# Patient Record
Sex: Male | Born: 1970 | Hispanic: Yes | Marital: Married | Smoking: Never smoker
Health system: Southern US, Community
[De-identification: ages and names within clinical notes are randomized; demographics above are authoritative.]

## PROBLEM LIST (undated history)

## (undated) DIAGNOSIS — A048 Other specified bacterial intestinal infections: Secondary | ICD-10-CM

## (undated) HISTORY — PX: UPPER GI ENDOSCOPY: SHX6162

## (undated) HISTORY — DX: Other specified bacterial intestinal infections: A04.8

---

## 2018-07-30 ENCOUNTER — Other Ambulatory Visit: Payer: Self-pay

## 2018-07-30 ENCOUNTER — Encounter: Payer: Self-pay | Admitting: Family Medicine

## 2018-07-30 ENCOUNTER — Ambulatory Visit: Payer: Self-pay | Attending: Family Medicine | Admitting: Family Medicine

## 2018-07-30 VITALS — BP 143/78 | HR 63 | Temp 98.4°F | Resp 18 | Ht 63.0 in | Wt 136.2 lb

## 2018-07-30 DIAGNOSIS — J392 Other diseases of pharynx: Secondary | ICD-10-CM

## 2018-07-30 DIAGNOSIS — H9201 Otalgia, right ear: Secondary | ICD-10-CM

## 2018-07-30 DIAGNOSIS — H9191 Unspecified hearing loss, right ear: Secondary | ICD-10-CM | POA: Insufficient documentation

## 2018-07-30 DIAGNOSIS — K219 Gastro-esophageal reflux disease without esophagitis: Secondary | ICD-10-CM | POA: Insufficient documentation

## 2018-07-30 DIAGNOSIS — H6091 Unspecified otitis externa, right ear: Secondary | ICD-10-CM

## 2018-07-30 DIAGNOSIS — J029 Acute pharyngitis, unspecified: Secondary | ICD-10-CM | POA: Insufficient documentation

## 2018-07-30 DIAGNOSIS — Z833 Family history of diabetes mellitus: Secondary | ICD-10-CM | POA: Insufficient documentation

## 2018-07-30 MED ORDER — PANTOPRAZOLE SODIUM 40 MG PO TBEC: 40.0000 mg | DELAYED_RELEASE_TABLET | Freq: Two times a day (BID) | ORAL | 1 refills | Status: DC

## 2018-07-30 MED ORDER — CIPROFLOXACIN HCL 500 MG PO TABS: 500.0000 mg | ORAL_TABLET | Freq: Two times a day (BID) | ORAL | 0 refills | Status: DC

## 2018-07-30 MED FILL — CIPROFLOXACIN HCL 500 MG TA: 500 | 10 days supply | Qty: 20 | Fill #0

## 2018-07-30 MED FILL — PANTOPRAZOLE SOD DR 40 MG T: 40 | 15 days supply | Qty: 30 | Fill #0

## 2018-07-30 NOTE — Progress Notes (Signed)
Subjective:    Patient ID: Douglas Morales, male    DOB: 1970/12/01, 47 y.o.   MRN: [id number]  HPI 47 year old male new to the practice.  Patient was offered video interpreter services secondary to language barrier but patient requested that his adult daughter translate for today's visit.  Per patient and daughter, patient has had right-sided ear pain off and on for more than a year.  Patient has been treated several times with antibiotics.  Patient and daughter report however that about 45 days ago, patient again had onset of right ear pain with drainage status post a trip to the beach.  Daughter states that patient was seen 2-3 times for his ear pain and she has antibiotic bottles with her for Augmentin 875 mg which she states he was initially prescribed to be on for 5 days and then later prescription for same antibiotic for 10 days.  Patient reports that prior to taking the antibiotics, he did have drainage/pus from his ear but now has not seen any recent drainage.  Patient does feel as if he has had some hearing loss in the right ear.  Patient states that he chronically feels that he has a dull pain in his right ear and also the area surrounding his ear which is about a 3 or 4 but then he sometimes has very sharp pain which is about a 8 or 9.  Pain feels as if it is in his ear and radiates to his throat.  Patient also with complaint of sore throat and patient has had a few days of sensation of nasal congestion with postnasal drainage.  Patient however states that the sore throat has been going on for months and patient feels as if it is difficult to swallow his saliva at the times as it makes his throat feel irritated.  Patient does not feel as if food actually gets stuck when he is eating but he does feel as if eating or drinking irritates his throat.  Patient does have some burping and belching and was on medication for reflux in the past.  Patient has not recently been on reflux medicine.   Patient denies any abdominal pain and no nausea.  Patient has had no dark stools and no blood in the stool.  Patient denies recent fever but did feel as if he was having chills last night.  Patient denies dizziness but feels as if there is a headache in the area behind and above his right ear along the scalp.  Patient/daughter are interested in patient seeing a specialist.  Daughter states that patient has gotten to the point where he has difficulty sleeping secondary to worrying about what may be wrong with his throat and ear as he is seen several doctors and has been taking antibiotics off and on and continues to have ear pain and pain/irritation in his throat.      Patient denies any other past medical problems.  Patient has had no past surgeries.  Patient has family history significant for diabetes.  Patient does not smoke but did smoke occasionally as a teenager.   Review of Systems  Constitutional: Positive for chills and fatigue. Negative for fever and unexpected weight change.  HENT: Positive for congestion, ear pain, hearing loss, sore throat and trouble swallowing. Negative for dental problem, facial swelling, sinus pressure and voice change.   Respiratory: Negative for cough and shortness of breath.   Cardiovascular: Negative for chest pain, palpitations and leg swelling.  Gastrointestinal: Negative  for abdominal pain, blood in stool and nausea.  Genitourinary: Negative for dysuria and frequency.  Musculoskeletal: Negative for arthralgias and back pain.  Neurological: Positive for headaches. Negative for dizziness.       Objective:   Physical Exam BP (!) 143/78   Pulse 63   Temp 98.4 F (36.9 C) (Oral)   Resp 18   Ht 5\' 3"  (1.6 m)   Wt 136 lb 3.2 oz (61.8 kg)   SpO2 98%   BMI 24.13 kg/m  Vital signs and nurse's note reviewed General- well-nourished, well-developed male in no acute distress.  Patient is accompanied by his daughter at today's visit ENT- patient with mild  discomfort with palpation at the tragus of the right ear.  Patient with some mild erythema of the ear canal and patient does have some whitish drainage/discharge in the posterior ear canal near the TM.  Patient with some erythema of the inferior portion of the right TM.  Patient's left TM is gray and within normal.  Patient with mild edema of the nasal mucosa with mild clear discharge, patient with mild posterior pharynx erythema. Neck-supple, patient does have some discomfort with palpation along the anterior cervical chain lymph nodes at the level of the thyroid.  No thyromegaly, no appreciable lymphadenopathy. Lungs-clear to auscultation bilaterally. Cardiovascular-regular rate and rhythm Abdomen-soft, nontender Back-no CVA tenderness Extremities-no edema      Assessment & Plan:  1. Right ear pain Patient with complaint of more than a month of persistent right ear pain and patient with sensation of decreased hearing in the right ear.  Patient will be referred to ENT for further evaluation - Ambulatory referral to ENT  2. Otitis externa of right ear, unspecified chronicity, unspecified type Patient's daughter has antibiotic bottles showing that patient has been treated with Augmentin 875 mg x 2 within the past month.  Patient likely has otitis externa as he did have some tenderness at the right tragus of the ear as well as some discharge within the ear canal.  Patient will be placed on Cipro.  Patient will also be referred to ENT secondary to his ear pain and sensation of hearing loss in the right ear. - Ambulatory referral to ENT - ciprofloxacin (CIPRO) 500 MG tablet; Take 1 tablet (500 mg total) by mouth 2 (two) times daily.  Dispense: 20 tablet; Refill: 0  3. Throat irritation Patient with complaint of chronic throat irritation with increased discomfort with swallowing.  Patient will be placed on Protonix in case his throat irritation is related to acid reflux/GERD but patient will also be  referred to ENT for further evaluation and treatment - Ambulatory referral to ENT - pantoprazole (PROTONIX) 40 MG tablet; Take 1 tablet (40 mg total) by mouth 2 (two) times daily. To reduce stomach acid  Dispense: 30 tablet; Refill: 1  An After Visit Summary was printed and given to the patient.  Return for as needed; 2 weeks if not seen by ENT.

## 2018-07-30 NOTE — Progress Notes (Signed)
Patient requested for  Flu shot  Pain in ear has been going on for about 45 days

## 2018-08-18 ENCOUNTER — Ambulatory Visit: Payer: Self-pay | Admitting: Family Medicine

## 2019-06-09 ENCOUNTER — Emergency Department (HOSPITAL_COMMUNITY)
Admission: EM | Admit: 2019-06-09 | Discharge: 2019-06-10 | Disposition: A | Discharge status: Home / Self Care | Payer: Self-pay | Attending: Emergency Medicine | Admitting: Emergency Medicine

## 2019-06-09 ENCOUNTER — Encounter (HOSPITAL_COMMUNITY): Payer: Self-pay

## 2019-06-09 ENCOUNTER — Other Ambulatory Visit: Payer: Self-pay

## 2019-06-09 DIAGNOSIS — R1013 Epigastric pain: Secondary | ICD-10-CM | POA: Insufficient documentation

## 2019-06-09 DIAGNOSIS — Z79899 Other long term (current) drug therapy: Secondary | ICD-10-CM | POA: Insufficient documentation

## 2019-06-09 LAB — URINALYSIS, ROUTINE W REFLEX MICROSCOPIC
Bilirubin Urine: NEGATIVE
Glucose, UA: NEGATIVE mg/dL
Hgb urine dipstick: NEGATIVE
Ketones, ur: NEGATIVE mg/dL
Leukocytes,Ua: NEGATIVE
Nitrite: NEGATIVE
Protein, ur: NEGATIVE mg/dL
Specific Gravity, Urine: 1.02 (ref 1.005–1.030)
pH: 6 (ref 5.0–8.0)

## 2019-06-09 LAB — COMPREHENSIVE METABOLIC PANEL
ALT: 25 U/L (ref 0–44)
AST: 24 U/L (ref 15–41)
Albumin: 4.4 g/dL (ref 3.5–5.0)
Alkaline Phosphatase: 74 U/L (ref 38–126)
Anion gap: 8 (ref 5–15)
BUN: 12 mg/dL (ref 6–20)
CO2: 27 mmol/L (ref 22–32)
Calcium: 9.3 mg/dL (ref 8.9–10.3)
Chloride: 101 mmol/L (ref 98–111)
Creatinine, Ser: 1 mg/dL (ref 0.61–1.24)
GFR calc Af Amer: >60 mL/min (ref >60)
GFR calc non Af Amer: >60 mL/min (ref >60)
Glucose, Bld: 79 mg/dL (ref 70–99)
Potassium: 3.2 mmol/L — ABNORMAL LOW (ref 3.5–5.1)
Sodium: 136 mmol/L (ref 135–145)
Total Bilirubin: 0.7 mg/dL (ref 0.3–1.2)
Total Protein: 7.3 g/dL (ref 6.5–8.1)

## 2019-06-09 LAB — CBC
HCT: 41.4 % (ref 39.0–52.0)
Hemoglobin: 14.5 g/dL (ref 13.0–17.0)
MCH: 30.9 pg (ref 26.0–34.0)
MCHC: 35 g/dL (ref 30.0–36.0)
MCV: 88.3 fL (ref 80.0–100.0)
Platelets: 307 10*3/uL (ref 150–400)
RBC: 4.69 MIL/uL (ref 4.22–5.81)
RDW: 12.6 % (ref 11.5–15.5)
WBC: 7.5 10*3/uL (ref 4.0–10.5)
nRBC: 0 % (ref 0.0–0.2)

## 2019-06-09 LAB — LIPASE, BLOOD: Lipase: 27 U/L (ref 11–51)

## 2019-06-09 MED ORDER — SODIUM CHLORIDE 0.9% FLUSH: 3.0000 mL | Freq: Once | INTRAVENOUS | Status: DC

## 2019-06-09 NOTE — ED Triage Notes (Signed)
Pt reports abd pain for 3 months and a burning sensation when he eats. Pt was seen at PCP 1 month ago, given rx for GERD and GI referral in Aug. Pt came here due to continued pain

## 2019-06-09 NOTE — ED Notes (Signed)
Call wife with any updates

## 2019-06-10 ENCOUNTER — Encounter (HOSPITAL_COMMUNITY): Payer: Self-pay | Admitting: Student

## 2019-06-10 ENCOUNTER — Emergency Department (HOSPITAL_COMMUNITY): Payer: Self-pay

## 2019-06-10 MED ORDER — SUCRALFATE 1 GM/10ML PO SUSP: 1.0000 g | Freq: Three times a day (TID) | ORAL | 0 refills | Status: DC

## 2019-06-10 MED ORDER — POTASSIUM CHLORIDE CRYS ER 20 MEQ PO TBCR
40.0000 meq | EXTENDED_RELEASE_TABLET | Freq: Once | ORAL | Status: AC
  Administered 2019-06-10: 40 meq via ORAL
  Filled 2019-06-10: qty 2

## 2019-06-10 MED ORDER — POTASSIUM CHLORIDE CRYS ER 20 MEQ PO TBCR: 20.0000 meq | EXTENDED_RELEASE_TABLET | Freq: Every day | ORAL | 0 refills | Status: DC

## 2019-06-10 MED ORDER — PANTOPRAZOLE SODIUM 40 MG PO TBEC: 40.0000 mg | DELAYED_RELEASE_TABLET | Freq: Every day | ORAL | 0 refills | Status: DC

## 2019-06-10 MED ORDER — FAMOTIDINE IN NACL 20-0.9 MG/50ML-% IV SOLN
20.0000 mg | Freq: Once | INTRAVENOUS | Status: AC
  Administered 2019-06-10: 20 mg via INTRAVENOUS
  Filled 2019-06-10: qty 50

## 2019-06-10 MED ORDER — SODIUM CHLORIDE 0.9 % IV BOLUS
500.0000 mL | Freq: Once | INTRAVENOUS | Status: AC
  Administered 2019-06-10: 01:00:00 via INTRAVENOUS

## 2019-06-10 MED ORDER — LIDOCAINE VISCOUS HCL 2 % MT SOLN
15.0000 mL | Freq: Once | OROMUCOSAL | Status: AC
  Administered 2019-06-10: 15 mL via ORAL
  Filled 2019-06-10: qty 15

## 2019-06-10 MED ORDER — ONDANSETRON 4 MG PO TBDP: 4.0000 mg | ORAL_TABLET | Freq: Three times a day (TID) | ORAL | 0 refills | Status: DC | PRN

## 2019-06-10 MED ORDER — ALUM & MAG HYDROXIDE-SIMETH 200-200-20 MG/5ML PO SUSP
30.0000 mL | Freq: Once | ORAL | Status: AC
  Administered 2019-06-10: 30 mL via ORAL
  Filled 2019-06-10: qty 30

## 2019-06-10 NOTE — Discharge Instructions (Addendum)
You were seen in the emergency department today for abdominal pain your work-up was reassuring.  Your potassium was a bit low, please follow attached diet guidelines and take the supplement provided.  We suspect your stomach pain is related to acid buildup/irritation in her sending you home with Protonix and Carafate to help with this.  We are sending home with Zofran to take as needed for nausea/vomiting.  We have prescribed you new medication(s) today. Discuss the medications prescribed today with your pharmacist as they can have adverse effects and interactions with your other medicines including over the counter and prescribed medications. Seek medical evaluation if you start to experience new or abnormal symptoms after taking one of these medicines, seek care immediately if you start to experience difficulty breathing, feeling of your throat closing, facial swelling, or rash as these could be indications of a more serious allergic reaction Please take your medicines as prescribed.  Please follow attached diet guidelines to help with your potassium levels as well as to help with stomach irritation.  Follow-up with your GI doctor or call 1 of the GI doctors listed in your discharge instructions for an appointment sooner.  Follow-up with your primary care within 3 days.  Return to the ER for new or worsening symptoms including but not limited to worsening pain, inability to keep fluids down, vomiting blood, blood in your stool, or fever, or any other concerns.  Google Translate:  Usted fue visto hoy en el departamento de emergencias por dolor abdominal, su trabajo fue tranquilizador. Su potasio estaba un poco bajo, siga las pautas de dieta adjuntas y tome el suplemento proporcionado. Sospechamos que su dolor de estmago est relacionado con la acumulacin de cido / irritacin en ella envindole a casa con Protonix y Carafate para ayudar con esto. Estamos enviando a casa con Zofran para tomar segn sea  necesario para las nuseas / vmitos.  Le hemos recetado nuevos medicamentos hoy. Discuta los medicamentos recetados hoy con su farmacutico, ya que pueden tener efectos adversos e interacciones con sus otros medicamentos, incluidos los de venta libre y los medicamentos recetados. Busque evaluacin mdica si comienza a experimentar sntomas nuevos o anormales despus de tomar uno de estos medicamentos, busque atencin mdica de inmediato si comienza a experimentar dificultad para respirar, sensacin de cierre de garganta, hinchazn facial o sarpullido, ya que estos podran ser indicios de un problema ms grave. reaccin alrgica Por favor tome sus medicamentos como se los recetaron. Siga las pautas de dieta adjuntas para ayudar con sus niveles de potasio, as como para ayudar con la irritacin estomacal.  Haga un seguimiento con su mdico GI o llame a 1 de los mdicos GI que figuran en sus instrucciones de alta para una cita antes. Haga un seguimiento con su atencin primaria dentro de los 3 das. Regrese a la sala de emergencias por sntomas nuevos o que empeoren, que [place], [place] otros, empeoramiento del [place], incapacidad para [employer] lquidos, vmitos de [place], [place] en las heces o [place], o cualquier otra inquietud.

## 2019-06-10 NOTE — ED Provider Notes (Signed)
[facility] Provider Note   CSN: [id number] Arrival date & time: 06/09/19  2038     History   Chief Complaint Chief Complaint  Patient presents with  . Abdominal Pain    HPI Mont DuttonJose Alfredo Morales is a 48 y.o. male without significant past medical hx who presents to the ED w/ complaints of abdominal pain x 3 months. Patient describes the pain as a burning discomfort to the epigastric area which is there the majority of the time, but is not constant. He states it is currently an 8/10 in severity, worse with eating, no alleviating factors. He has had nausea w/o vomiting. He initially was having constipation but this has improved, last BM today somewhat small amount but has had decreased PO intake. Seen by PCP for sxs- given course of protonix which he took w/o relief & is now out of now, only took short course. Has referral to GI 08/05, states given continued pain came to ED. Denies fever, chills, emesis, hematemesis, diarrhea, melena, hematochezia, chest pain, or dyspnea. He drinks alcohol very rarely. Does not take NSAIDs regularly. No prior abdominal surgeries.   Translator line utilized throughout Audiological scientistencounter.     HPI  History reviewed. No pertinent past medical history.  There are no active problems to display for this patient.   History reviewed. No pertinent surgical history.      Home Medications    Prior to Admission medications   Medication Sig Start Date End Date Taking? Authorizing Provider  ciprofloxacin (CIPRO) 500 MG tablet Take 1 tablet (500 mg total) by mouth 2 (two) times daily. 07/30/18   Fulp, Cammie, MD  pantoprazole (PROTONIX) 40 MG tablet Take 1 tablet (40 mg total) by mouth 2 (two) times daily. To reduce stomach acid 07/30/18   Cain SaupeFulp, Cammie, MD    Family History History reviewed. No pertinent family history.  Social History Social History   Tobacco Use  . Smoking status: Never Smoker  . Smokeless tobacco:  Never Used  Substance Use Topics  . Alcohol use: Not on file    Comment: occasionally   . Drug use: Never     Allergies   Patient has no known allergies.   Review of Systems Review of Systems  Constitutional: Negative for chills and fever.  Respiratory: Negative for shortness of breath.   Cardiovascular: Negative for chest pain.  Gastrointestinal: Positive for abdominal pain, constipation (resolved per patient) and nausea. Negative for anal bleeding, blood in stool, diarrhea and vomiting.  Genitourinary: Negative for dysuria, scrotal swelling and testicular pain.  All other systems reviewed and are negative.    Physical Exam Updated Vital Signs BP (!) 155/82 (BP Location: Right Arm)   Pulse (!) 56   Temp 98.1 F (36.7 C) (Oral)   Resp 19   SpO2 97%   Physical Exam Vitals signs and nursing note reviewed.  Constitutional:      General: He is not in acute distress.    Appearance: He is well-developed. He is not toxic-appearing.  HENT:     Head: Normocephalic and atraumatic.  Eyes:     General:        Right eye: No discharge.        Left eye: No discharge.     Conjunctiva/sclera: Conjunctivae normal.  Neck:     Musculoskeletal: Neck supple.  Cardiovascular:     Rate and Rhythm: Normal rate and regular rhythm.  Pulmonary:     Effort: Pulmonary effort is normal. No  respiratory distress.     Breath sounds: Normal breath sounds. No wheezing, rhonchi or rales.  Abdominal:     General: Bowel sounds are normal. There is no distension.     Palpations: Abdomen is soft.     Tenderness: There is abdominal tenderness in the epigastric area. There is no guarding or rebound. Negative signs include Murphy's sign and McBurney's sign.  Skin:    General: Skin is warm and dry.     Findings: No rash.  Neurological:     Mental Status: He is alert.     Comments: Clear speech.   Psychiatric:        Behavior: Behavior normal.      ED Treatments / Results  Labs (all labs  ordered are listed, but only abnormal results are displayed) Labs Reviewed  COMPREHENSIVE METABOLIC PANEL - Abnormal; Notable for the following components:      Result Value   Potassium 3.2 (*)    All other components within normal limits  LIPASE, BLOOD  CBC  URINALYSIS, ROUTINE W REFLEX MICROSCOPIC    EKG None  Radiology Dg Abd Acute W/chest  Result Date: 06/10/2019 CLINICAL DATA:  Abdominal pain for 3 months. EXAM: DG ABDOMEN ACUTE W/ 1V CHEST COMPARISON:  None. FINDINGS: The cardiomediastinal contours are normal. The lungs are clear. There is no free intra-abdominal air. No dilated bowel loops to suggest obstruction. Small volume of stool throughout the colon. No radiopaque calculi. No acute osseous abnormalities are seen. IMPRESSION: Negative abdominal radiographs. No acute cardiopulmonary disease. Electronically Signed   By: Narda RutherfordMelanie  Sanford M.D.   On: 06/10/2019 02:10    Procedures Procedures (including critical care time)  Medications Ordered in ED Medications  sodium chloride flush (NS) 0.9 % injection 3 mL (3 mLs Intravenous Not Given 06/10/19 0059)     Initial Impression / Assessment and Plan / ED Course  I have reviewed the triage vital signs and the nursing notes.  Pertinent labs & imaging results that were available during my care of the patient were reviewed by me and considered in my medical decision making (see chart for details).    Patient presents to the ED with complaints of abdominal pain. Patient nontoxic appearing, in no apparent distress, vitals without significant abnormality. On exam patient mildly tender to the epigastric region, no peritoneal signs.   Triage ER work-up reviewed:  CBC: NO leukocytosis or anemia.  CMP: Mild hypokalemia- will orally replace. NO other electrolyte derangement. LFTs & renal function preserved Lipase: WNL UA: No UTI Abdominal series x-ray- no obstruction or free air.   On repeat abdominal exam patient remains without  peritoneal signs, doubt cholecystitis, pancreatitis, diverticulitis, appendicitis, bowel obstruction/perforation. Hgb/hct WNL, no BUN elevation, no complaints of hematemesis/melena/hematochezia- doubt acute GI bleed. Patient tolerating PO in the emergency department & is feeling improved. PUD vs. GERD suspected. Will discharge home with supportive measures. Will provide additional local GI group information to facilitate potential closer follow up. I discussed results, treatment plan, need for follow-up, and return precautions with the patient. Provided opportunity for questions, patient confirmed understanding and is in agreement with plan.   Final Clinical Impressions(s) / ED Diagnoses   Final diagnoses:  Epigastric abdominal pain    ED Discharge Orders         Ordered    pantoprazole (PROTONIX) 40 MG tablet  Daily     06/10/19 0319    sucralfate (CARAFATE) 1 GM/10ML suspension  3 times daily with meals & bedtime  06/10/19 0319    potassium chloride SA (K-DUR) 20 MEQ tablet  Daily     06/10/19 0319    ondansetron (ZOFRAN ODT) 4 MG disintegrating tablet  Every 8 hours PRN     06/10/19 0319           Amaryllis Dyke, PA-C 06/10/19 0321    Ripley Fraise, MD 06/10/19 0530

## 2019-07-02 ENCOUNTER — Encounter: Payer: Self-pay | Admitting: Gastroenterology

## 2019-08-03 ENCOUNTER — Encounter: Payer: Self-pay | Admitting: Gastroenterology

## 2019-08-03 ENCOUNTER — Ambulatory Visit: Payer: Self-pay | Admitting: Gastroenterology

## 2019-08-03 VITALS — BP 120/74 | HR 82 | Temp 97.6°F | Ht 63.0 in | Wt 131.1 lb

## 2019-08-03 DIAGNOSIS — R1310 Dysphagia, unspecified: Secondary | ICD-10-CM

## 2019-08-03 DIAGNOSIS — R109 Unspecified abdominal pain: Secondary | ICD-10-CM

## 2019-08-03 NOTE — Progress Notes (Signed)
HPI: This is a very pleasant 48 year old man who was referred to me by Cain SaupeFulp, Cammie, MD  to evaluate abdominal pain, dysphasia.    We had arranged for professional Spanish interpreter to be present however at the last minute they canceled.  Fortunately he brought with him a family member who is bilingual.  Chief complaint is abdominal pain, dysphasia  Family member acted as a Nurse, learning disabilitytranslator, she is not a Radiographer, therapeuticprofessional because our Adult nurseprofessional translator failed to show.  I suspect some nuances of his history were lost in translation as is common  He has had a cold feeling in his lower abdomen for 6 or 7 months.  Sometimes he describes a sense of pain or burning.  He points to his peri-umbilicus as the region.  Pain is intermittent.  It is not related to eating his weight is been stable.  He does not take NSAIDs.  He does have intermittent dysphasia for years and he had some dark streaks on his stool about a month ago.  No family history of colon cancer  Old Data Reviewed:  Lab test July 2020: CBC completely normal, complete metabolic profile normal except for slightly low potassium, lipase was normal urinalysis was normal.  Acute abdominal 2 view July 2020 done for "abdominal pain for 3 months" was completely normal.     Review of systems: Pertinent positive and negative review of systems were noted in the above HPI section. All other review negative.   History reviewed. No pertinent past medical history.  History reviewed. No pertinent surgical history.  Current Outpatient Medications  Medication Sig Dispense Refill  . pantoprazole (PROTONIX) 40 MG tablet Take 1 tablet (40 mg total) by mouth daily. (Patient not taking: Reported on 08/03/2019) 30 tablet 0   No current facility-administered medications for this visit.     Allergies as of 08/03/2019  . (No Known Allergies)    History reviewed. No pertinent family history.  Social History   Socioeconomic History  . Marital  status: Married    Spouse name: Not on file  . Number of children: Not on file  . Years of education: Not on file  . Highest education level: Not on file  Occupational History  . Not on file  Social Needs  . Financial resource strain: Not on file  . Food insecurity    Worry: Not on file    Inability: Not on file  . Transportation needs    Medical: Not on file    Non-medical: Not on file  Tobacco Use  . Smoking status: Never Smoker  . Smokeless tobacco: Never Used  Substance and Sexual Activity  . Alcohol use: Not on file    Comment: occasionally   . Drug use: Never  . Sexual activity: Not on file  Lifestyle  . Physical activity    Days per week: Not on file    Minutes per session: Not on file  . Stress: Not on file  Relationships  . Social Musicianconnections    Talks on phone: Not on file    Gets together: Not on file    Attends religious service: Not on file    Active member of club or organization: Not on file    Attends meetings of clubs or organizations: Not on file    Relationship status: Not on file  . Intimate partner violence    Fear of current or ex partner: Not on file    Emotionally abused: Not on file  Physically abused: Not on file    Forced sexual activity: Not on file  Other Topics Concern  . Not on file  Social History Narrative  . Not on file     Physical Exam: BP 120/74 (BP Location: Left Arm, Patient Position: Sitting, Cuff Size: Normal)   Pulse 82   Temp 97.6 F (36.4 C)   Ht 5\' 3"  (1.6 m)   Wt 131 lb 2 oz (59.5 kg)   BMI 23.23 kg/m  Constitutional: generally well-appearing Psychiatric: alert and oriented x3 Eyes: extraocular movements intact Mouth: oral pharynx moist, no lesions Neck: supple no lymphadenopathy Cardiovascular: heart regular rate and rhythm Lungs: clear to auscultation bilaterally Abdomen: soft, nontender, nondistended, no obvious ascites, no peritoneal signs, normal bowel sounds Extremities: no lower extremity edema  bilaterally Skin: no lesions on visible extremities   Assessment and plan: 48 y.o. male with dyspeptic symptoms, dysphasia, dark stools about a month ago  Much is lost in translation here.  It does seem that he is having some issues with dysphasia and some pains in his abdomen.  He is also noted some dark stools about a month ago.  I recommended upper endoscopy to evaluate these.  I see no reason for any further blood tests or imaging studies prior to then.    Please see the "Patient Instructions" section for addition details about the plan.   Owens Loffler, MD [facility] 08/03/2019, 3:18 PM  Cc: Antony Blackbird, MD

## 2019-08-03 NOTE — Patient Instructions (Signed)
You have been scheduled for an endoscopy. Please follow written instructions given to you at your visit today. If you use inhalers (even only as needed), please bring them with you on the day of your procedure. Your physician has requested that you go to www.startemmi.com and enter the access code given to you at your visit today. This web site gives a general overview about your procedure. However, you should still follow specific instructions given to you by our office regarding your preparation for the procedure.  Thank you for entrusting me with your care and choosing [facility].  Dr Jacobs  

## 2019-08-04 ENCOUNTER — Telehealth: Payer: Self-pay

## 2019-08-04 NOTE — Telephone Encounter (Signed)
Covid-19 screening questions   Do you now or have you had a fever in the last 14 days? NO   Do you have any respiratory symptoms of shortness of breath or cough now or in the last 14 days? NO, just usual allergies.   Do you have any family members or close contacts with diagnosed or suspected Covid-19 in the past 14 days? NO   Have you been tested for Covid-19 and found to be positive? NO

## 2019-08-05 ENCOUNTER — Other Ambulatory Visit: Payer: Self-pay | Admitting: Gastroenterology

## 2019-08-05 ENCOUNTER — Other Ambulatory Visit: Payer: Self-pay

## 2019-08-05 ENCOUNTER — Encounter: Payer: Self-pay | Admitting: Gastroenterology

## 2019-08-05 ENCOUNTER — Ambulatory Visit (AMBULATORY_SURGERY_CENTER): Payer: Self-pay | Admitting: Gastroenterology

## 2019-08-05 VITALS — BP 128/74 | HR 66 | Temp 99.5°F | Resp 15 | Ht 63.0 in | Wt 131.0 lb

## 2019-08-05 DIAGNOSIS — K2970 Gastritis, unspecified, without bleeding: Secondary | ICD-10-CM

## 2019-08-05 DIAGNOSIS — K2950 Unspecified chronic gastritis without bleeding: Secondary | ICD-10-CM

## 2019-08-05 DIAGNOSIS — B9681 Helicobacter pylori [H. pylori] as the cause of diseases classified elsewhere: Secondary | ICD-10-CM

## 2019-08-05 DIAGNOSIS — R109 Unspecified abdominal pain: Secondary | ICD-10-CM

## 2019-08-05 MED ORDER — SODIUM CHLORIDE 0.9 % IV SOLN: 500.0000 mL | Freq: Once | INTRAVENOUS | Status: AC

## 2019-08-05 NOTE — Op Note (Signed)
[facility] Patient Name: Douglas Morales Procedure Date: 08/05/2019 3:10 PM MRN: [id number] Endoscopist: Rachael Fee , MD Age: 48 Referring MD:  Date of Birth: 1971/02/24 Gender: Male Account #: [id number] Procedure:                Upper GI endoscopy Indications:              Dyspepsia, Dysphagia Medicines:                Monitored Anesthesia Care Procedure:                Pre-Anesthesia Assessment:                           - Prior to the procedure, a History and Physical                            was performed, and patient medications and                            allergies were reviewed. The patient's tolerance of                            previous anesthesia was also reviewed. The risks                            and benefits of the procedure and the sedation                            options and risks were discussed with the patient.                            All questions were answered, and informed consent                            was obtained. Prior Anticoagulants: The patient has                            taken no previous anticoagulant or antiplatelet                            agents. ASA Grade Assessment: II - A patient with                            mild systemic disease. After reviewing the risks                            and benefits, the patient was deemed in                            satisfactory condition to undergo the procedure.                           After obtaining informed consent, the endoscope was  passed under direct vision. Throughout the                            procedure, the patient's blood pressure, pulse, and                            oxygen saturations were monitored continuously. The                            Endoscope was introduced through the mouth, and                            advanced to the second part of duodenum. The upper                            GI endoscopy was accomplished  without difficulty.                            The patient tolerated the procedure well. Scope In: Scope Out: Findings:                 Mild inflammation characterized by erythema and                            friability was found in the gastric antrum.                            Biopsies were taken with a cold forceps for                            histology.                           The exam was otherwise without abnormality. Complications:            No immediate complications. Estimated Blood Loss:     Estimated blood loss: none. Impression:               - Mild, non-specific gastritis, biopsied to check                            for H. pylori.                           - The examination was otherwise normal. Recommendation:           - Patient has a contact number available for                            emergencies. The signs and symptoms of potential                            delayed complications were discussed with the                            patient. Return to normal activities tomorrow.  Written discharge instructions were provided to the                            patient.                           - Resume previous diet.                           - Continue present medications.                           - Await pathology results. Rachael Feeaniel P Knute Mazzuca, MD 08/05/2019 3:28:04 PM This report has been signed electronically.

## 2019-08-05 NOTE — Progress Notes (Signed)
PT taken to PACU. Monitors in place. VSS. Report given to RN. 

## 2019-08-05 NOTE — Patient Instructions (Signed)
USTED TUVO UN PROCEDIMIENTO ENDOSCPICO HOY EN EL [facility]:   Lea el informe del procedimiento que se le entreg para cualquier pregunta especfica sobre lo que se encontr durante su examen.  Si el informe del examen no responde a sus preguntas, por favor llame a su gastroenterlogo para aclararlo.  Si usted solicit que no se le den detalles de lo que se encontr en su procedimiento al acompaante que le va a cuidar, entonces el informe del procedimiento se ha incluido en un sobre sellado para que usted lo revise despus cuando le sea ms conveniente.   LO QUE PUEDE ESPERAR: Algunas sensaciones de hinchazn en el abdomen.  Puede tener ms gases de lo normal.  El caminar puede ayudarle a eliminar el aire que se le puso en el tracto gastrointestinal durante el procedimiento y reducir la hinchazn.  Si le hicieron una endoscopia inferior (como una colonoscopia o una sigmoidoscopia flexible), podra notar manchas de sangre en las heces fecales o en el papel higinico.  Si se someti a una preparacin intestinal para su procedimiento, es posible que no tenga una evacuacin intestinal normal durante algunos das.   Tenga en cuenta:  Es posible que note un poco de irritacin y congestin en la nariz o algn drenaje.  Esto es debido al oxgeno utilizado durante su procedimiento.  No hay que preocuparse y esto debe desaparecer ms o menos en un da.   SNTOMAS PARA REPORTAR INMEDIATAMENTE:  Despus de una endoscopia inferior (colonoscopia o sigmoidoscopia flexible):  Cantidades excesivas de sangre en las heces fecales  Sensibilidad significativa o empeoramiento de los dolores abdominales   Hinchazn aguda del abdomen que antes no tena   Fiebre de 100F o ms   Despus de la endoscopia superior (EGD)  Vmitos de sangre o material como caf molido   Dolor en el pecho o dolor debajo de los omplatos que antes no tena   Dolor o dificultad persistente para tragar  Falta de aire que antes no  tena   Fiebre de 100F o ms  Heces fecales negras y pegajosas   Para asuntos urgentes o de emergencia, puede comunicarse con un gastroenterlogo a cualquier hora llamando al [phone].  DIETA:  Recomendamos una comida pequea al principio, pero luego puede continuar con su dieta normal.  Tome muchos lquidos, pero debe evitar las bebidas alcohlicas durante 24 horas.    ACTIVIDAD:  Debe planear tomarse las cosas con calma por el resto del da y no debe CONDUCIR ni usar maquinaria pesada hasta maana (debido a los medicamentos de sedacin utilizados durante el examen).     SEGUIMIENTO: Nuestro personal llamar al nmero que aparece en su historial al siguiente da hbil de su procedimiento para ver cmo se siente y para responder cualquier pregunta o inquietud que pueda tener con respecto a la informacin que se le dio despus del procedimiento. Si no podemos contactarle, le dejaremos un mensaje.  Sin embargo, si se siente bien y no tiene ningn problema, no es necesario que nos devuelva la llamada.  Asumiremos que ha regresado a sus actividades diarias normales sin incidentes. Si se le tomaron algunas biopsias, le contactaremos por telfono o por carta en las prximas 3 semanas.  Si no ha sabido nada sobre las biopsias en el transcurso de 3 semanas, por favor llmenos al [phone].   FIRMAS/CONFIDENCIALIDAD: Usted y/o el acompaante que le cuide han firmado documentos que se ingresarn en su historial mdico electrnico.  Estas firmas atestiguan el hecho   de que la informacin anterior  

## 2019-08-05 NOTE — Progress Notes (Signed)
Called to room to assist during endoscopic procedure.  Patient ID and intended procedure confirmed with present staff. Received instructions for my participation in the procedure from the performing physician.  

## 2019-08-07 ENCOUNTER — Telehealth: Payer: Self-pay | Admitting: *Deleted

## 2019-08-07 NOTE — Telephone Encounter (Signed)
  Follow up Call-  Call back number 08/05/2019  Post procedure Call Back phone  # [phone] cell  Permission to leave phone message Yes  Some recent data might be hidden     Patient questions:  Do you have a fever, pain , or abdominal swelling? No. Pain Score  0 *  Have you tolerated food without any problems? Yes.    Have you been able to return to your normal activities? Yes.    Do you have any questions about your discharge instructions: Diet   No. Medications  No. Follow up visit  No.  Do you have questions or concerns about your Care? No.  Actions: * If pain score is 4 or above: No action needed, pain <4.  1. Have you developed a fever since your procedure? no  2.   Have you had an respiratory symptoms (SOB or cough) since your procedure? no  3.   Have you tested positive for COVID 19 since your procedure no  4.   Have you had any family members/close contacts diagnosed with the COVID 19 since your procedure?  no   If yes to any of these questions please route to Joylene John, RN and Alphonsa Gin, Therapist, sports.

## 2019-08-12 ENCOUNTER — Other Ambulatory Visit: Payer: Self-pay

## 2019-08-12 MED ORDER — PYLERA 140-125-125 MG PO CAPS: 3.0000 | ORAL_CAPSULE | Freq: Three times a day (TID) | ORAL | 0 refills | Status: DC

## 2019-08-14 ENCOUNTER — Telehealth: Payer: Self-pay | Admitting: Gastroenterology

## 2019-08-14 MED ORDER — BISMUTH SUBSALICYLATE 262 MG PO TABS: 2.0000 | ORAL_TABLET | Freq: Four times a day (QID) | ORAL | 0 refills | Status: AC

## 2019-08-14 MED ORDER — METRONIDAZOLE 500 MG PO TABS: 500.0000 mg | ORAL_TABLET | Freq: Four times a day (QID) | ORAL | 0 refills | Status: AC

## 2019-08-14 MED ORDER — TETRACYCLINE HCL 500 MG PO CAPS: 500.0000 mg | ORAL_CAPSULE | Freq: Four times a day (QID) | ORAL | 0 refills | Status: AC

## 2019-08-14 NOTE — Telephone Encounter (Signed)
Please advise. Thank you

## 2019-08-14 NOTE — Telephone Encounter (Signed)
Medications sent to pharmacy.  Douglas Morales will call patient and explain the regimen to him in Spanish

## 2019-08-14 NOTE — Telephone Encounter (Signed)
    If Pylera is cost prohibitive, then please call in it's component parts (each for 14 days) as follows: Bismuth subsalicylate 262mg  pills, two pills QID; Metronidazoe 500mg  pill,one pill QID; Tetracycline 500mg  pill, one pill QID.  At the same time they need to be on twice daily PPI.  If currently only on once daily PPI then they should supplement with OTC omeprazole 20mg  pill daily as well (12 hours after the first PPI dose).  thanks

## 2019-08-14 NOTE — Telephone Encounter (Signed)
Pt called to inform that price for antibiotic is over $600, he would like something more affordable.

## 2019-08-24 ENCOUNTER — Telehealth: Payer: Self-pay | Admitting: Gastroenterology

## 2019-08-24 NOTE — Telephone Encounter (Signed)
Please see note below. It looks like pt is taking omeprazole 20mg  otc BID. Please advise regarding note below.

## 2019-08-24 NOTE — Telephone Encounter (Signed)
Pt states the medications for H-Pylori have worsened acid reflux sxs. Pt wants to know if this is normal and if dose of Omeprazole could be increased. He also wants to know if acid reflux continues after treatment if he could cotinue taking omeprazole. Finally, he wamts to know if he needs to follow a special diet to relief some of the sxs.

## 2019-08-25 NOTE — Telephone Encounter (Signed)
Pt scheduled to see Dr. Ardis Hughs 11/20/20at 1:50pm. Pt notified via Max Sane. Pt may increase the omeprazole to 40mg  twice a day while taking the medication for H pylori. After he finishes the treatment he could stay on omeprazole 40mg  daily.

## 2019-08-25 NOTE — Telephone Encounter (Signed)
Actually it is not uncommon for GERD symptoms to worsen after H. Pylori treatment.  He can certainly stay on omeeprazole 40mg  once daily following eradication.  Avoiding etoh, caffeine, chocolate, laying down for bed within 3 hours of eating can all worsen GERD as well.  ROV with me in 6-7 weeks to see how he is doing, check for eradication.  thanks

## 2019-08-26 ENCOUNTER — Telehealth: Payer: Self-pay | Admitting: Gastroenterology

## 2019-08-26 NOTE — Telephone Encounter (Signed)
FYI-- Called the patient with the telephonic interpreter on the line Cori Razor [phone]). This RN spent over 35 minutes on the phone with the patient and the interpreter. Difficult to assess the patient and keep the patient focused enough to thoroughly answer the questions asked. What could be gathered from the conversation is the patient is have continued abd pain around his "belly button" and "waistline." The patient describes this a new and very painful. He is still on the h pylori treatment, he has 4 days left. There seemed to be some confusion as to what PPI he was taking. It is charted that the patient was told the increase his Protonix to BID but the script that was written was by an ED MD in July. The patient stated he was taking omeprazole but this is not listed in the patient's medication list. Due to the confusion and the new abd pain, the patient is scheduled to see Amy Esterwood on 10/12 at 2:00 pm. The patient did inquire about when to go to the ED for evaluation but stated that he would also cancel his appointment on Monday if he felt better. The patient was encouraged to come it even if he started to feel better. This RN is not convinced the patient will do this. The patient was under the impression that with the administration of his h pylori medications would suddenly completely stop his abd pain.

## 2019-08-27 NOTE — Telephone Encounter (Signed)
Thanks for speaking with him.

## 2019-08-31 ENCOUNTER — Ambulatory Visit: Payer: Self-pay | Admitting: Physician Assistant

## 2019-09-02 ENCOUNTER — Telehealth: Payer: Self-pay | Admitting: Gastroenterology

## 2019-09-02 MED ORDER — PANTOPRAZOLE SODIUM 40 MG PO TBEC: 40.0000 mg | DELAYED_RELEASE_TABLET | Freq: Every day | ORAL | 0 refills | Status: AC

## 2019-09-02 MED ORDER — PANTOPRAZOLE SODIUM 40 MG PO TBEC: 40.0000 mg | DELAYED_RELEASE_TABLET | Freq: Every day | ORAL | 0 refills | Status: DC

## 2019-09-02 NOTE — Telephone Encounter (Signed)
Pt finished treatment last Sunday but still has acid reflux sxs, he feels burning in his stomach and throat. He wants to know what else he can take.

## 2019-09-02 NOTE — Telephone Encounter (Signed)
The pt finished his h pylori treatment on Sunday and states that his symptoms are no better.  I did discuss with him via interpreter # [phone] that it can take several weeks for his symptoms to resolve after h pylori treatment.  He was told to continue protonix and he could also take pepcid at bedtime if needed.  He will call back if his symptoms do not resolve.

## 2019-10-09 ENCOUNTER — Ambulatory Visit: Payer: Self-pay | Admitting: Gastroenterology

## 2019-10-09 ENCOUNTER — Encounter: Payer: Self-pay | Admitting: Gastroenterology

## 2019-10-09 VITALS — BP 144/82 | HR 74 | Temp 98.1°F | Ht 65.0 in | Wt 136.0 lb

## 2019-10-09 DIAGNOSIS — R109 Unspecified abdominal pain: Secondary | ICD-10-CM

## 2019-10-09 DIAGNOSIS — K5900 Constipation, unspecified: Secondary | ICD-10-CM

## 2019-10-09 NOTE — Progress Notes (Signed)
Review of pertinent gastrointestinal problems: 1.  H. pylori positive gastritis found during EGD September 2020 done for abdominal pain, GERD, dysphagia.  Labs July 2020 show normal CBC, normal complete metabolic profile. 2.  Chronic GERD  HPI: This is a very pleasant 48 year old Spanish-speaking man who is here with a professional interpreter today  I last saw the time of an EGD.  See those results summarized above.  He completed H. pylori eradication antibiotics and after some initial discomforts he is really feeling much better.  He does not have any other gassiness that he was bothered by and his appetite has definitely improved.  Today he is time he has issues with lower abdominal pains and intermittent constipation.  He does not see any overt GI bleeding.  He does not have a family history of colon cancer    Chief complaint is H. pylori gastritis, constipation  ROS: complete GI ROS as described in HPI, all other review negative.  Constitutional:  No unintentional weight loss   Past Medical History:  Diagnosis Date  . H. pylori infection     Past Surgical History:  Procedure Laterality Date  . UPPER GI ENDOSCOPY      Current Outpatient Medications  Medication Sig Dispense Refill  . Bismuth Subsalicylate 469 MG TABS Take 2 tablets (524 mg total) by mouth 4 (four) times daily. (Patient not taking: Reported on 10/09/2019) 112 tablet 0  . metroNIDAZOLE (FLAGYL) 500 MG tablet Take 1 tablet (500 mg total) by mouth 4 (four) times daily. (Patient not taking: Reported on 10/09/2019) 56 tablet 0  . pantoprazole (PROTONIX) 40 MG tablet Take 1 tablet (40 mg total) by mouth daily. (Patient not taking: Reported on 10/09/2019) 30 tablet 0  . tetracycline (SUMYCIN) 500 MG capsule Take 1 capsule (500 mg total) by mouth 4 (four) times daily. (Patient not taking: Reported on 10/09/2019) 56 capsule 0   Current Facility-Administered Medications  Medication Dose Route Frequency Provider Last  Rate Last Dose  . 0.9 %  sodium chloride infusion  500 mL Intravenous Once Milus Banister, MD        Allergies as of 10/09/2019  . (No Known Allergies)    History reviewed. No pertinent family history.  Social History   Socioeconomic History  . Marital status: Married    Spouse name: Not on file  . Number of children: Not on file  . Years of education: Not on file  . Highest education level: Not on file  Occupational History  . Not on file  Social Needs  . Financial resource strain: Not on file  . Food insecurity    Worry: Not on file    Inability: Not on file  . Transportation needs    Medical: Not on file    Non-medical: Not on file  Tobacco Use  . Smoking status: Never Smoker  . Smokeless tobacco: Never Used  Substance and Sexual Activity  . Alcohol use: Not on file    Comment: occasionally   . Drug use: Never  . Sexual activity: Not on file  Lifestyle  . Physical activity    Days per week: Not on file    Minutes per session: Not on file  . Stress: Not on file  Relationships  . Social Herbalist on phone: Not on file    Gets together: Not on file    Attends religious service: Not on file    Active member of club or organization: Not on file  Attends meetings of clubs or organizations: Not on file    Relationship status: Not on file  . Intimate partner violence    Fear of current or ex partner: Not on file    Emotionally abused: Not on file    Physically abused: Not on file    Forced sexual activity: Not on file  Other Topics Concern  . Not on file  Social History Narrative  . Not on file     Physical Exam: BP (!) 144/82   Pulse 74   Temp 98.1 F (36.7 C)   Ht 5\' 5"  (1.651 m)   Wt 136 lb (61.7 kg)   BMI 22.63 kg/m  Constitutional: generally well-appearing Psychiatric: alert and oriented x3 Abdomen: soft, nontender, nondistended, no obvious ascites, no peritoneal signs, normal bowel sounds No peripheral edema noted in lower  extremities  Assessment and plan: 48 y.o. male with H. pylori gastritis, constipation  His constipation seems mild, likely functional.  I recommended a trial of Citrucel fiber supplement on a daily basis and he will call to report on his response in 4 weeks.  If he is not satisfied with improved bowel habits and then likely I will recommend a colonoscopy for further evaluation.  Please see the "Patient Instructions" section for addition details about the plan.  58, MD [facility] 10/09/2019, 2:03 PM

## 2019-10-09 NOTE — Patient Instructions (Signed)
Start over the counter Citracel slowly working your way up to one heaping spoonful over a week.   Call back in one month with an update.

## 2020-01-14 ENCOUNTER — Encounter: Payer: Self-pay | Admitting: Gastroenterology

## 2020-01-14 ENCOUNTER — Telehealth: Payer: Self-pay

## 2020-01-14 ENCOUNTER — Ambulatory Visit (INDEPENDENT_AMBULATORY_CARE_PROVIDER_SITE_OTHER): Payer: Self-pay | Admitting: Gastroenterology

## 2020-01-14 VITALS — BP 140/80 | HR 68 | Temp 98.5°F | Ht 65.0 in | Wt 138.8 lb

## 2020-01-14 DIAGNOSIS — R1084 Generalized abdominal pain: Secondary | ICD-10-CM

## 2020-01-14 NOTE — Telephone Encounter (Signed)
-----   Message from Rachael Fee, MD sent at 01/14/2020  4:32 PM EST ----- Regarding: RE: mutual patient Thanks. It may have been through an urgent clinic.  Was pretty unclear through the translator.  We'll clarify with him and get him tested if still needed.  Mickaela Starlin, Can you contact him, spanish only speaker however.  Need to know who tested him to check for h. Pylori eradication.  Was it at an urgent clinic? Would like to have the results sent here wherever he had it done.  Thanks ----- Message ----- From: Cain Saupe, MD Sent: 01/14/2020   1:09 PM EST To: Rachael Fee, MD Subject: mutual patient                                 I am not aware of this patient having an H. Pylori eradication test ----- Message ----- From: Rachael Fee, MD Sent: 01/14/2020   9:50 AM EST To: Cain Saupe, MD

## 2020-01-14 NOTE — Progress Notes (Signed)
Review of pertinent gastrointestinal problems: 1.  H. pylori positive gastritis found during EGD September 2020 done for abdominal pain, GERD, dysphagia.  Labs July 2020 show normal CBC, normal complete metabolic profile. 2.  Chronic GERD   HPI: This is a pleasant 49 year old Spanish-speaking only man who is here with a family member and a professional interpreter is helping with translation.  He tells me that his primary care physician did stool testing to check for H. pylori eradication and it was negative.  I do not have those results however.  He is having lower abdominal pains, he calls an irritation or burning.  These occur on both sides of his abdomen also low in his mid pelvis and also low in his mid back.  He says eating causes a cold sensation throughout his belly.  He can intermittently be constipated but never sees blood in his stool.  He does at times see yellow-colored stools.  He is very nervous about all the symptoms.  He has a lot of stress.  He wakes up in the middle the night with a bothersome feeling throughout his body sides of his abdomen and even in his knees.  He has gained 5 or 6 pounds in the past few months  Chief complaint is generalized abdominal pains  ROS: complete GI ROS as described in HPI, all other review negative.  Constitutional:  No unintentional weight loss   Past Medical History:  Diagnosis Date  . H. pylori infection     Past Surgical History:  Procedure Laterality Date  . UPPER GI ENDOSCOPY      Current Outpatient Medications  Medication Sig Dispense Refill  . Bismuth Subsalicylate 008 MG TABS Take 2 tablets (524 mg total) by mouth 4 (four) times daily. 112 tablet 0  . metroNIDAZOLE (FLAGYL) 500 MG tablet Take 1 tablet (500 mg total) by mouth 4 (four) times daily. 56 tablet 0  . pantoprazole (PROTONIX) 40 MG tablet Take 1 tablet (40 mg total) by mouth daily. 30 tablet 0  . tetracycline (SUMYCIN) 500 MG capsule Take 1 capsule (500 mg total)  by mouth 4 (four) times daily. 56 capsule 0   Current Facility-Administered Medications  Medication Dose Route Frequency Provider Last Rate Last Admin  . 0.9 %  sodium chloride infusion  500 mL Intravenous Once Milus Banister, MD        Allergies as of 01/14/2020  . (No Known Allergies)    No family history on file.  Social History   Socioeconomic History  . Marital status: Married    Spouse name: Not on file  . Number of children: Not on file  . Years of education: Not on file  . Highest education level: Not on file  Occupational History  . Not on file  Tobacco Use  . Smoking status: Never Smoker  . Smokeless tobacco: Never Used  Substance and Sexual Activity  . Alcohol use: Not on file    Comment: occasionally   . Drug use: Never  . Sexual activity: Not on file  Other Topics Concern  . Not on file  Social History Narrative  . Not on file   Social Determinants of Health   Financial Resource Strain:   . Difficulty of Paying Living Expenses: Not on file  Food Insecurity:   . Worried About Charity fundraiser in the Last Year: Not on file  . Ran Out of Food in the Last Year: Not on file  Transportation Needs:   .  Lack of Transportation (Medical): Not on file  . Lack of Transportation (Non-Medical): Not on file  Physical Activity:   . Days of Exercise per Week: Not on file  . Minutes of Exercise per Session: Not on file  Stress:   . Feeling of Stress : Not on file  Social Connections:   . Frequency of Communication with Friends and Family: Not on file  . Frequency of Social Gatherings with Friends and Family: Not on file  . Attends Religious Services: Not on file  . Active Member of Clubs or Organizations: Not on file  . Attends Banker Meetings: Not on file  . Marital Status: Not on file  Intimate Partner Violence:   . Fear of Current or Ex-Partner: Not on file  . Emotionally Abused: Not on file  . Physically Abused: Not on file  . Sexually  Abused: Not on file     Physical Exam: BP 140/80   Pulse 68   Temp 98.5 F (36.9 C)   Ht 5\' 5"  (1.651 m)   Wt 138 lb 12.8 oz (63 kg)   BMI 23.10 kg/m  Constitutional: generally well-appearing Psychiatric: alert and oriented x3 Abdomen: soft, nontender, nondistended, no obvious ascites, no peritoneal signs, normal bowel sounds No peripheral edema noted in lower extremities  Assessment and plan: 49 y.o. male with previous H. pylori, generalized abdominal pains  He tells me that he was tested for H. pylori stool eradication and it was negative.  He is having generalized lower abdominal discomforts that are unlikely to be anything serious.  He has been gaining weight and has no overt GI bleeding I recommended a CT scan abdomen pelvis as initial work-up for these abdominal pains.  Please see the "Patient Instructions" section for addition details about the plan.  58, MD [facility] 01/14/2020, 9:08 AM   Total time on date of encounter was 30 minutes (this included time spent preparing to see the patient reviewing records; obtaining and/or reviewing separately obtained history; performing a medically appropriate exam and/or evaluation; counseling and educating the patient and family if present; ordering medications, tests or procedures if applicable; and documenting clinical information in the health record).

## 2020-01-14 NOTE — Patient Instructions (Signed)
If you are age 48 or older, your body mass index should be between 23-30. Your Body mass index is 23.1 kg/m. If this is out of the aforementioned range listed, please consider follow up with your Primary Care Provider.  If you are age 42 or younger, your body mass index should be between 19-25. Your Body mass index is 23.1 kg/m. If this is out of the aformentioned range listed, please consider follow up with your Primary Care Provider.   You have been scheduled for a CT scan of the abdomen and pelvis at [facility] (Radiology Department)  You are scheduled on 01-26-20 at 1:00pm. You should arrive 15 minutes prior to your appointment time for registration. Please follow the written instructions below on the day of your exam:  WARNING: IF YOU ARE ALLERGIC TO IODINE/X-RAY DYE, PLEASE NOTIFY RADIOLOGY IMMEDIATELY AT [phone]. YOU WILL BE GIVEN A 13 HOUR PREMEDICATION PREP.  1) Do not eat or drink anything after 9:00am(4 hours prior to your test) 2) You have been given 2 bottles of oral contrast to drink. The solution may taste better if refrigerated, but do NOT add ice or any other liquid to this solution. Shake well before drinking.    Drink 1 bottle of contrast _0 :00am (2 hours prior to your exam)  Drink 1 bottle of contrast @ 12:00pm(1 hour prior to your exam)  You may take any medications as prescribed with a small amount of water, if necessary. If you take any of the following medications: METFORMIN, GLUCOPHAGE, GLUCOVANCE, AVANDAMET, RIOMET, FORTAMET, [place] MET, JANUMET, GLUMETZA or METAGLIP, you MAY be asked to HOLD this medication 48 hours AFTER the exam.  The purpose of you drinking the oral contrast is to aid in the visualization of your intestinal tract. The contrast solution may cause some diarrhea. Depending on your individual set of symptoms, you may also receive an intravenous injection of x-ray contrast/dye. Plan on being at [facility] for 30 minutes or longer,  depending on the type of exam you are having performed.  This test typically takes 30-45 minutes to complete.  If you have any questions regarding your exam or if you need to reschedule, you may call [phone] between the hours of 8:00 am and 5:00 pm, Monday-Friday.  ________________________________________________________________________________________  Due to recent changes in healthcare laws, you may see the results of your imaging and laboratory studies on MyChart before your provider has had a chance to review them.  We understand that in some cases there may be results that are confusing or concerning to you. Not all laboratory results come back in the same time frame and the provider may be waiting for multiple results in order to interpret others.  Please give [place] 48 hours in order for your provider to thoroughly review all the results before contacting the office for clarification of your results.   Thank you, Dr Ardis Hughs

## 2020-01-15 NOTE — Telephone Encounter (Signed)
Dr Christella Hartigan I spoke with the pt via interpreter and he provided the phone number to the clinic he had the H pylori stool testing completed.  [phone].  I spoke with them and they are faxing the results to your attention.

## 2020-01-15 NOTE — Telephone Encounter (Signed)
Left message on machine to call back via interpreter

## 2020-01-26 ENCOUNTER — Encounter (HOSPITAL_COMMUNITY): Payer: Self-pay

## 2020-01-26 ENCOUNTER — Ambulatory Visit (HOSPITAL_COMMUNITY)
Admission: RE | Admit: 2020-01-26 | Discharge: 2020-01-26 | Disposition: A | Discharge status: Home / Self Care | Payer: Self-pay | Source: Ambulatory Visit | Attending: Gastroenterology | Admitting: Gastroenterology

## 2020-01-26 ENCOUNTER — Other Ambulatory Visit: Payer: Self-pay

## 2020-01-26 DIAGNOSIS — R1084 Generalized abdominal pain: Secondary | ICD-10-CM | POA: Insufficient documentation

## 2020-01-26 MED ORDER — SODIUM CHLORIDE (PF) 0.9 % IJ SOLN
INTRAMUSCULAR | Status: AC
  Filled 2020-01-26: qty 50

## 2020-01-26 MED ORDER — IOHEXOL 300 MG/ML  SOLN
100.0000 mL | Freq: Once | INTRAMUSCULAR | Status: AC | PRN
  Administered 2020-01-26: 13:00:00 100 mL via INTRAVENOUS

## 2020-01-28 NOTE — Progress Notes (Signed)
Unable to reach pt letter mailed 

## 2020-02-04 ENCOUNTER — Telehealth: Payer: Self-pay | Admitting: Gastroenterology

## 2020-02-04 NOTE — Telephone Encounter (Signed)
Please call the patient. THe CT scan was normal. HE needs a colonoscopy to complete the GI worklup of his abdominal pains. [facility]. Thanks  The pt's daughter has been advised and will provide the information to the pt due to language barrier

## 2020-02-04 NOTE — Telephone Encounter (Signed)
Pt's inquired about CT results.

## 2020-05-10 IMAGING — CR DG ABDOMEN ACUTE W/ 1V CHEST
3 series · 3 of 3 positions shown · non-contrast
Comparison: None.

CLINICAL DATA: Abdominal pain for 3 months.

EXAM:
DG ABDOMEN ACUTE W/ 1V CHEST

[chest pa]
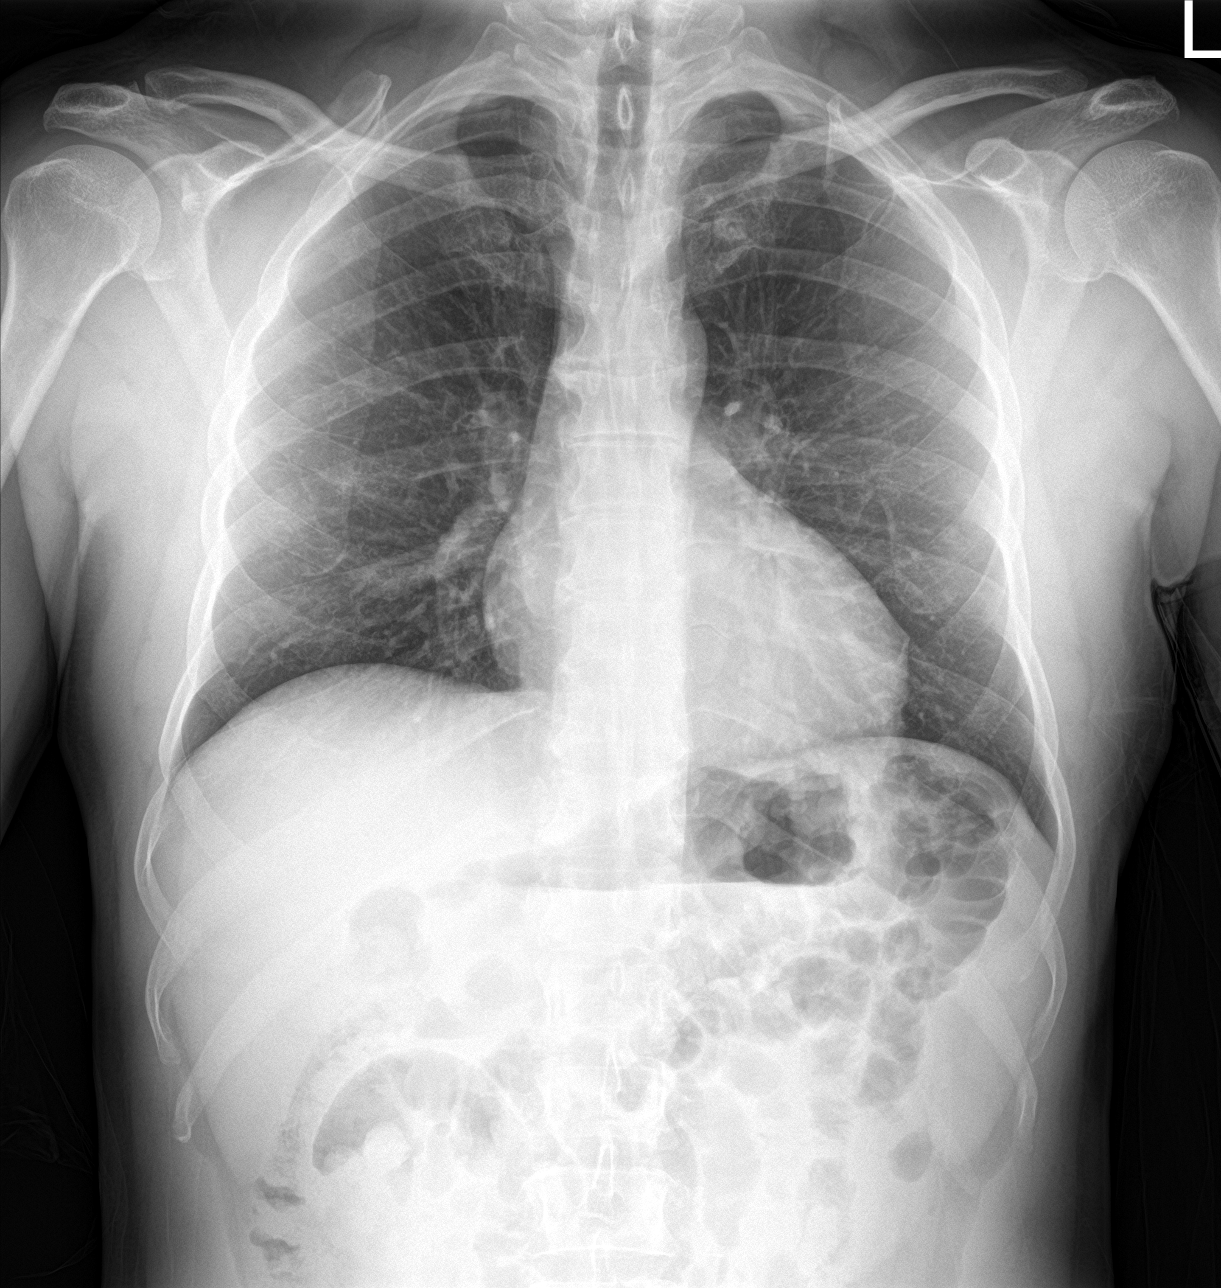

[abdomen erect]
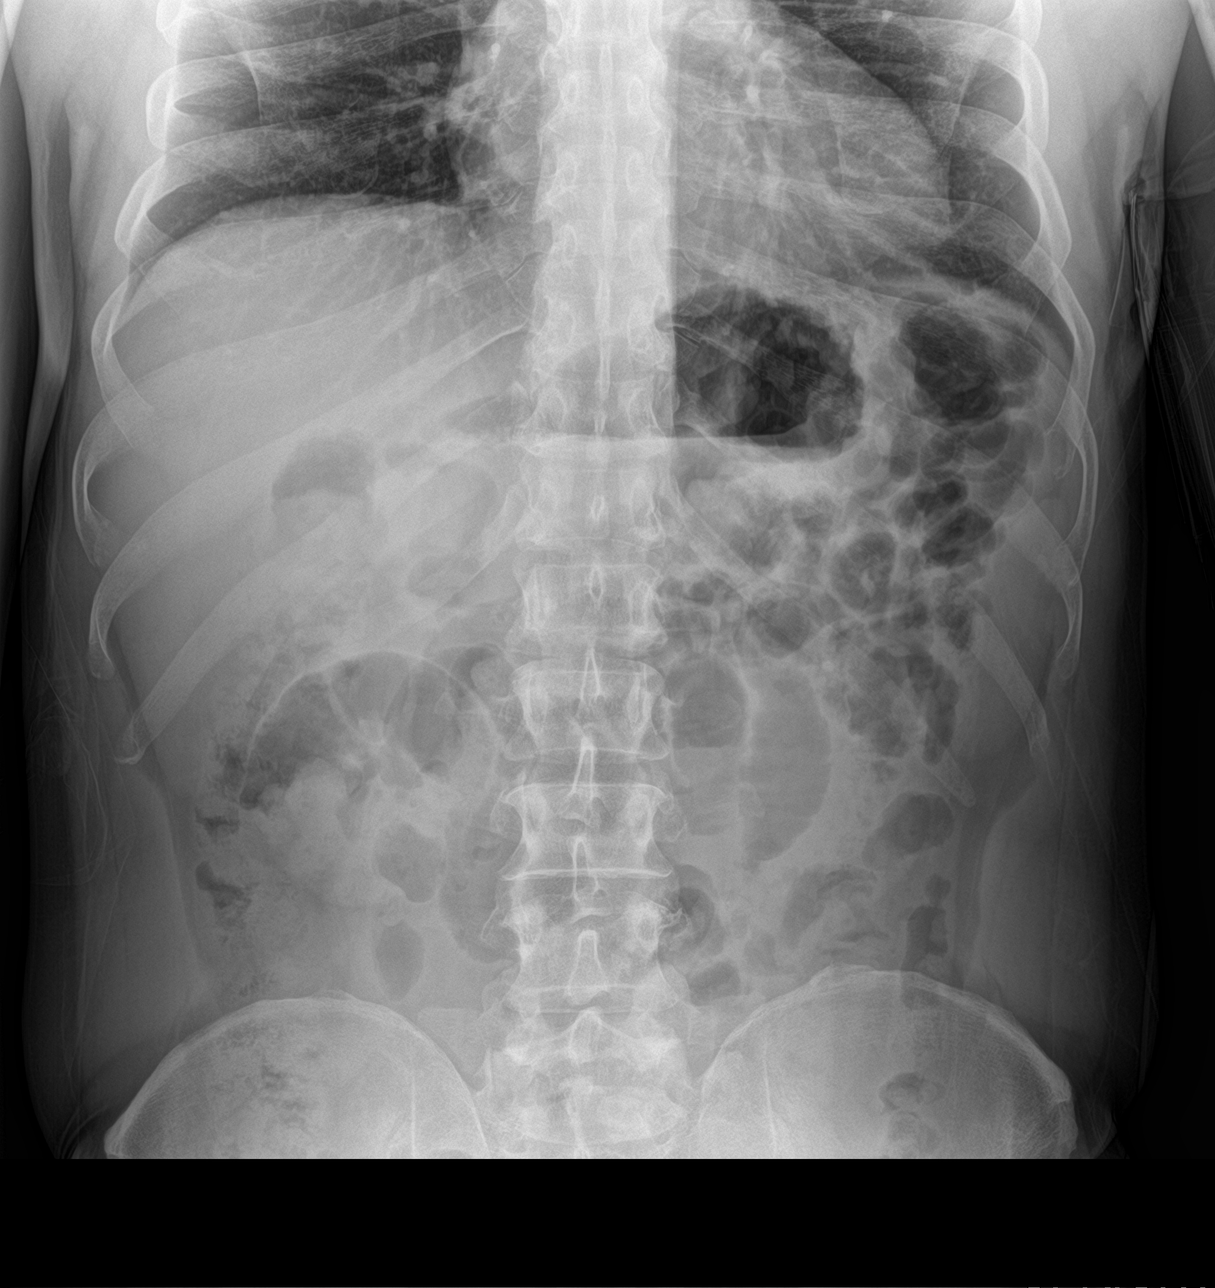

[abdomen supine]
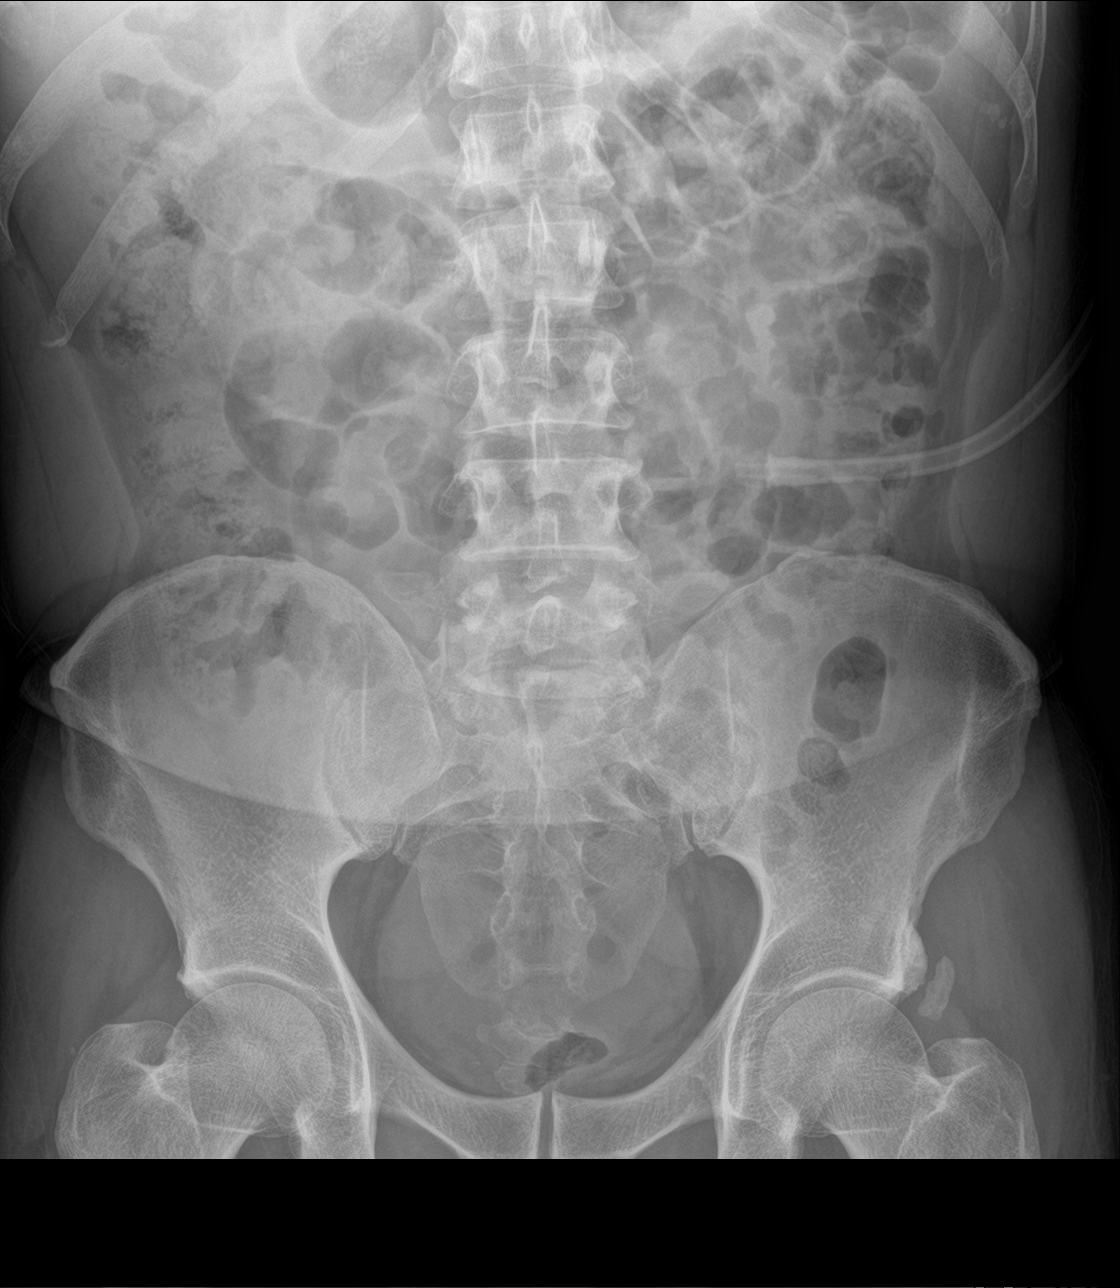

[3 of 3 positions shown; findings below may reference images not displayed]

FINDINGS: The cardiomediastinal contours are normal. The lungs are clear.
There is no free intra-abdominal air. No dilated bowel loops to
suggest obstruction. Small volume of stool throughout the colon. No
radiopaque calculi. No acute osseous abnormalities are seen.
IMPRESSION: Negative abdominal radiographs. No acute cardiopulmonary disease.

## 2020-12-26 IMAGING — CT CT ABD-PELV W/ CM
2 of 5 series · 16 of 46 positions shown, 18 images · IV contrast (APPLIED)
Comparison: None aside from plain film abdomen of 06/10/2019

CLINICAL DATA: Generalized abdominal pain.

EXAM:
CT ABDOMEN AND PELVIS WITH CONTRAST
TECHNIQUE: Multidetector CT imaging of the abdomen and pelvis was performed
using the standard protocol following bolus administration of
intravenous contrast.
CONTRAST:  100mL OMNIPAQUE IOHEXOL 300 MG/ML  SOLN

[Series 2: axial st · axial · 0.68mm/px · z∈[-410,-20]mm · 13 of 92 slices shown, 15 images]
[im 7/92  soft-tissue]
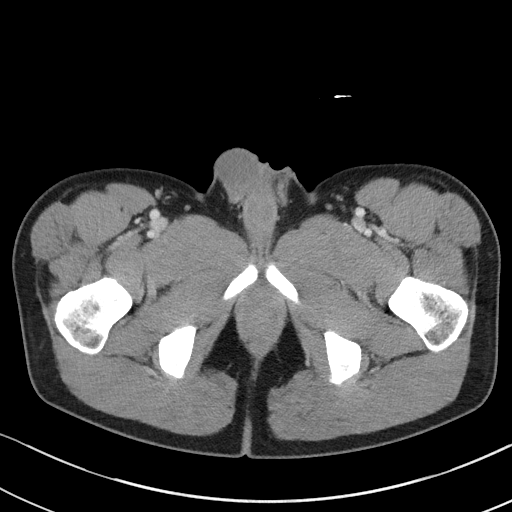
[im 7/92  bone]
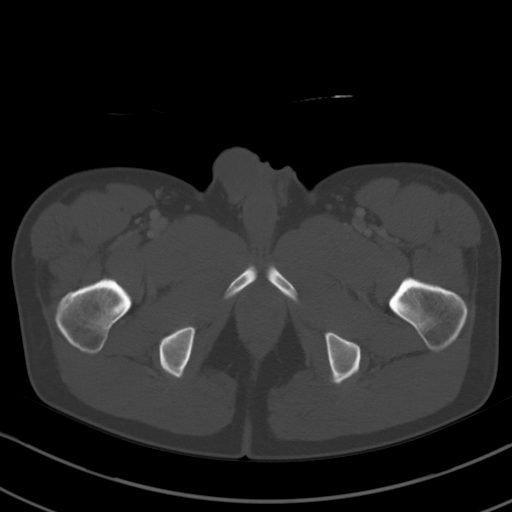
[im 14/92  soft-tissue]
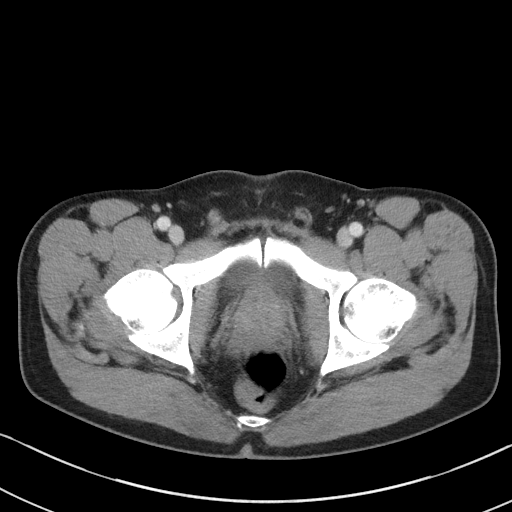
[im 20/92  soft-tissue]
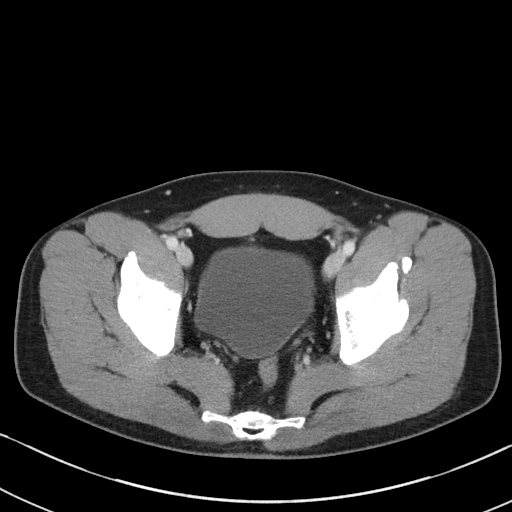
[im 27/92  soft-tissue]
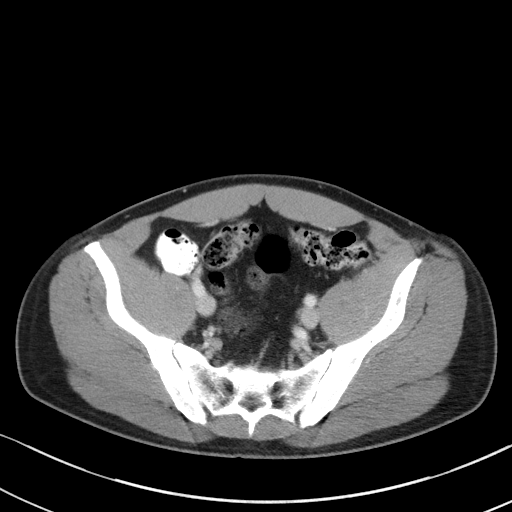
[im 33/92  soft-tissue]
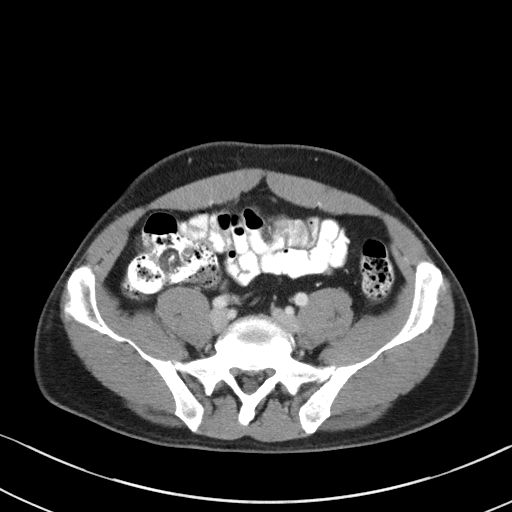
[im 40/92  soft-tissue]
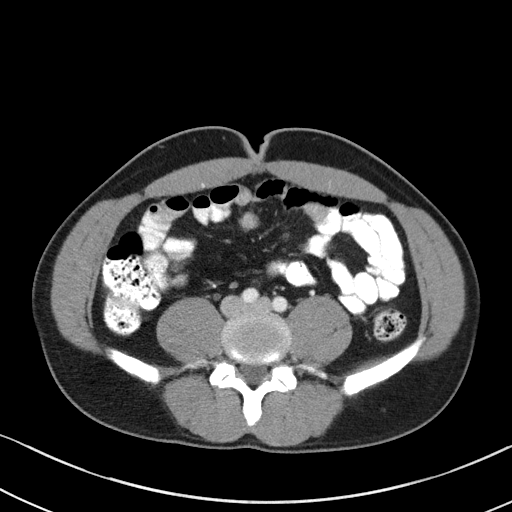
[im 46/92  soft-tissue]
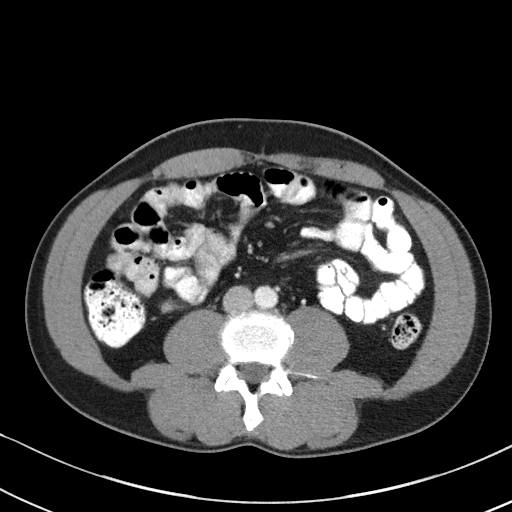
[im 53/92  soft-tissue]
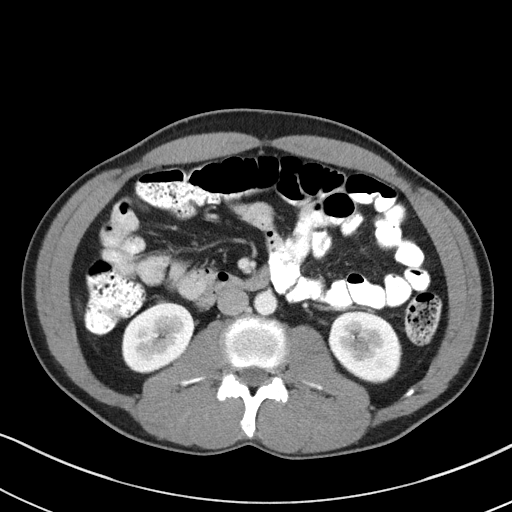
[im 59/92  soft-tissue]
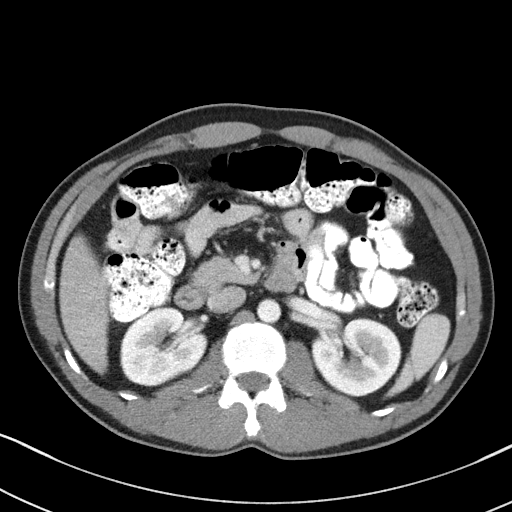
[im 59/92  bone]
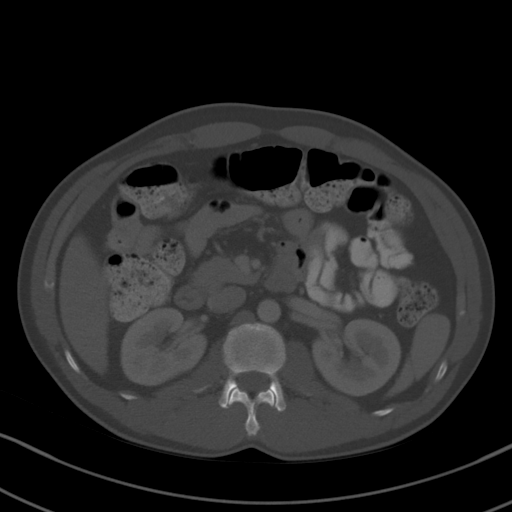
[im 66/92  soft-tissue]
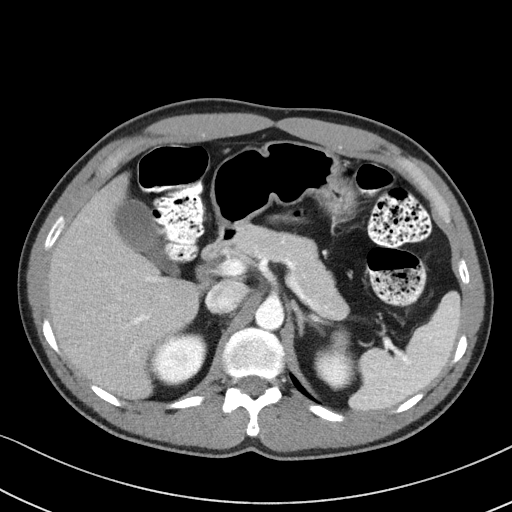
[im 72/92  soft-tissue]
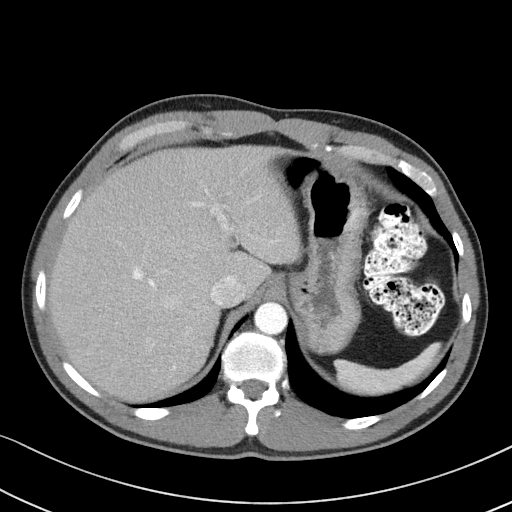
[im 79/92  soft-tissue]
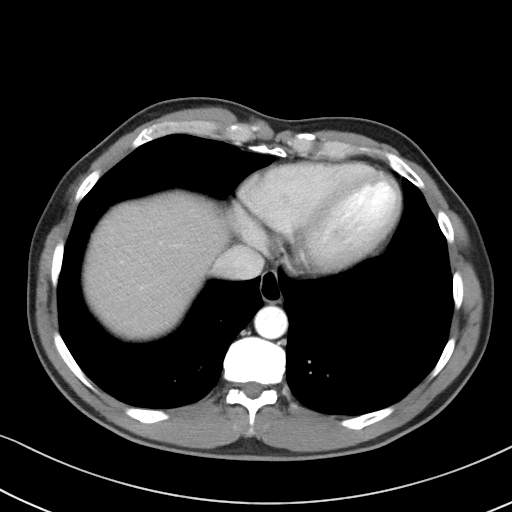
[im 85/92  soft-tissue]
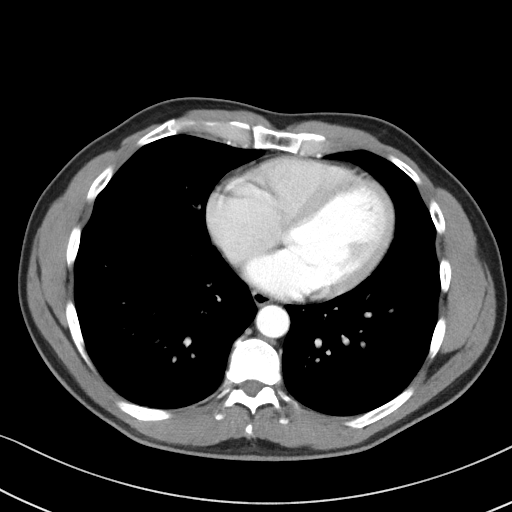

[Series 4: coronal st · coronal · 0.69mm/px · 3 of 90 slices shown]
[im 30/90  soft-tissue]
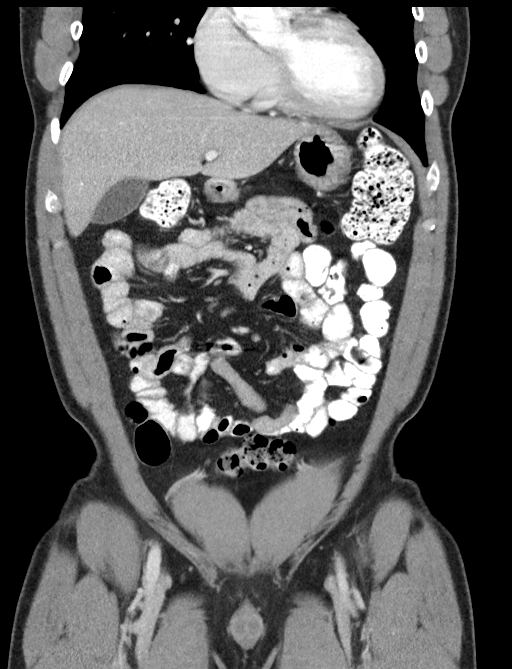
[im 40/90  soft-tissue]
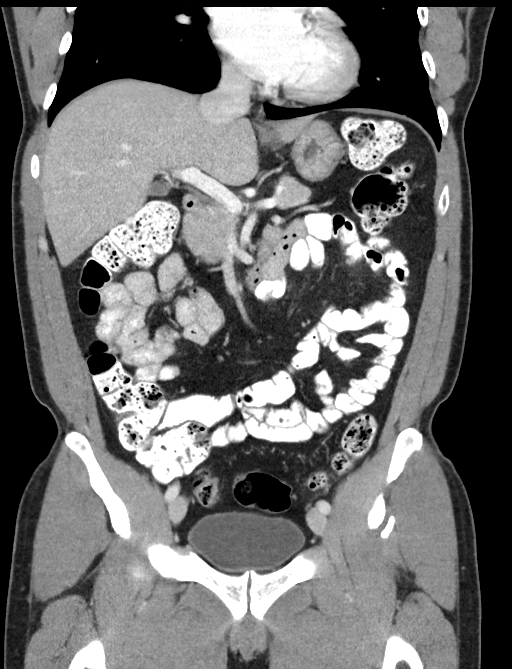
[im 50/90  soft-tissue]
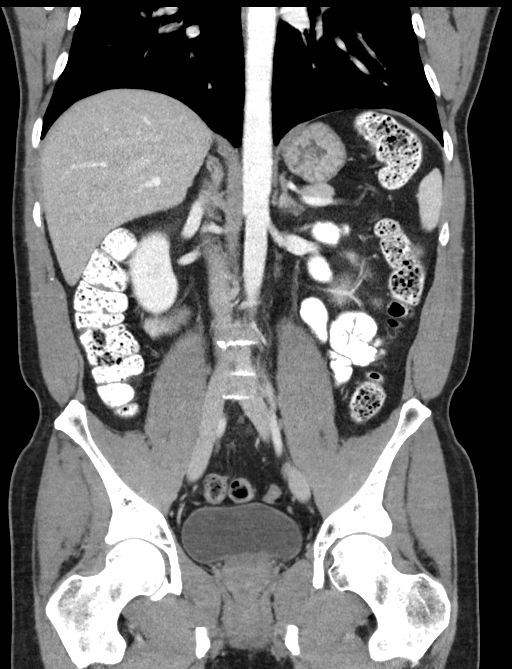

[16 of 46 positions shown; findings below may reference images not displayed]

FINDINGS: Lower chest: Lung bases are clear. No signs of consolidation or
evidence of pleural effusion.

Hepatobiliary: Liver is normal. No signs of biliary ductal dilation
or pericholecystic inflammation.

Pancreas: Pancreas is normal. No signs of ductal dilation or
inflammation.

Spleen: Spleen is normal size without focal lesion.

Adrenals/Urinary Tract: Adrenal glands are normal. The

Renal contours are smooth. No signs of hydronephrosis or suspicious
renal lesion.

No ureteral dilation. Urinary bladder is normal.

Stomach/Bowel: The appendix is normal. There is no sign of bowel
obstruction. Scattered stool and gas throughout the colon.

Vascular/Lymphatic: Normal caliber abdominal aorta. No signs of
atherosclerosis. No evidence of retroperitoneal or upper abdominal
lymphadenopathy.

No signs of pelvic lymphadenopathy.

Reproductive: Prostate is unremarkable by CT.

Other: No signs of free air. The no abscess.

Musculoskeletal: No acute bone finding or destructive bone process.
IMPRESSION: No acute findings in the abdomen or pelvis.

## 2022-11-07 ENCOUNTER — Ambulatory Visit
Admission: EM | Admit: 2022-11-07 | Discharge: 2022-11-07 | Disposition: A | Discharge status: Home / Self Care | Payer: Self-pay | Attending: Internal Medicine | Admitting: Internal Medicine

## 2022-11-07 DIAGNOSIS — Z1152 Encounter for screening for COVID-19: Secondary | ICD-10-CM | POA: Insufficient documentation

## 2022-11-07 DIAGNOSIS — J069 Acute upper respiratory infection, unspecified: Secondary | ICD-10-CM | POA: Insufficient documentation

## 2022-11-07 MED ORDER — FLUTICASONE PROPIONATE 50 MCG/ACT NA SUSP: 1.0000 | Freq: Every day | NASAL | 0 refills | Status: AC

## 2022-11-07 MED ORDER — BENZONATATE 100 MG PO CAPS: 100.0000 mg | ORAL_CAPSULE | Freq: Three times a day (TID) | ORAL | 0 refills | Status: AC | PRN

## 2022-11-07 NOTE — ED Triage Notes (Signed)
Pt c/o cough, body aches, headache   Onset ~ Friday

## 2022-11-07 NOTE — Discharge Instructions (Signed)
You have a viral upper respiratory infection which should run its course and self resolve with symptomatic treatment.  I have prescribed you a few different medications to alleviate symptoms.  COVID and flu test is pending.  We will call if it is positive.  Please follow-up if symptoms persist or worsen.

## 2022-11-07 NOTE — ED Provider Notes (Signed)
[facility]    CSN: [id number] Arrival date & time: 11/07/22  7253      History   Chief Complaint Chief Complaint  Patient presents with   Cough    HPI Douglas Morales is a 51 y.o. male.   Patient presents with 6-day history of cough, nasal congestion, generalized bodyaches, chills.  Patient reports that he felt feverish but denies any documented fever at home.  His wife has similar symptoms.  Reports that symptoms are improving.  He has not taken any medications to alleviate symptoms.  Denies chest pain, shortness of breath, sore throat, ear pain, nausea, vomiting, diarrhea, abdominal pain.  Patient denies formal diagnosis of asthma or COPD.   Cough   Past Medical History:  Diagnosis Date   H. pylori infection     There are no problems to display for this patient.   Past Surgical History:  Procedure Laterality Date   UPPER GI ENDOSCOPY         Home Medications    Prior to Admission medications   Medication Sig Start Date End Date Taking? Authorizing Provider  benzonatate (TESSALON) 100 MG capsule Take 1 capsule (100 mg total) by mouth every 8 (eight) hours as needed for cough. 11/07/22  Yes Asia Favata, Rolly Salter E, FNP  fluticasone (FLONASE) 50 MCG/ACT nasal spray Place 1 spray into both nostrils daily. 11/07/22  Yes Rickey Farrier, Acie Fredrickson, FNP  Bismuth Subsalicylate 262 MG TABS Take 2 tablets (524 mg total) by mouth 4 (four) times daily. 08/14/19   Rachael Fee, MD  metroNIDAZOLE (FLAGYL) 500 MG tablet Take 1 tablet (500 mg total) by mouth 4 (four) times daily. 08/14/19   Rachael Fee, MD  pantoprazole (PROTONIX) 40 MG tablet Take 1 tablet (40 mg total) by mouth daily. 09/02/19   Rachael Fee, MD  tetracycline (SUMYCIN) 500 MG capsule Take 1 capsule (500 mg total) by mouth 4 (four) times daily. 08/14/19   Rachael Fee, MD    Family History History reviewed. No pertinent family history.  Social History Social History   Tobacco Use    Smoking status: Never   Smokeless tobacco: Never  Substance Use Topics   Drug use: Never     Allergies   Patient has no known allergies.   Review of Systems Review of Systems Per HPI  Physical Exam Triage Vital Signs ED Triage Vitals  Enc Vitals Group     BP 11/07/22 1153 133/82     Pulse Rate 11/07/22 1153 75     Resp 11/07/22 1153 18     Temp 11/07/22 1153 98.3 F (36.8 C)     Temp Source 11/07/22 1153 Oral     SpO2 11/07/22 1153 95 %     Weight --      Height --      Head Circumference --      Peak Flow --      Pain Score 11/07/22 1154 5     Pain Loc --      Pain Edu? --      Excl. in GC? --    No data found.  Updated Vital Signs BP 133/82 (BP Location: Right Arm)   Pulse 75   Temp 98.3 F (36.8 C) (Oral)   Resp 18   SpO2 95%   Visual Acuity Right Eye Distance:   Left Eye Distance:   Bilateral Distance:    Right Eye Near:   Left Eye Near:    Bilateral Near:  Physical Exam Constitutional:      General: He is not in acute distress.    Appearance: Normal appearance. He is not toxic-appearing or diaphoretic.  HENT:     Head: Normocephalic and atraumatic.     Right Ear: Tympanic membrane and ear canal normal.     Left Ear: Tympanic membrane and ear canal normal.     Nose: Congestion present.     Mouth/Throat:     Mouth: Mucous membranes are moist.     Pharynx: No posterior oropharyngeal erythema.  Eyes:     Extraocular Movements: Extraocular movements intact.     Conjunctiva/sclera: Conjunctivae normal.     Pupils: Pupils are equal, round, and reactive to light.  Cardiovascular:     Rate and Rhythm: Normal rate and regular rhythm.     Pulses: Normal pulses.     Heart sounds: Normal heart sounds.  Pulmonary:     Effort: Pulmonary effort is normal. No respiratory distress.     Breath sounds: Normal breath sounds. No stridor. No wheezing, rhonchi or rales.  Abdominal:     General: Abdomen is flat. Bowel sounds are normal.     Palpations:  Abdomen is soft.  Musculoskeletal:        General: Normal range of motion.     Cervical back: Normal range of motion.  Skin:    General: Skin is warm and dry.  Neurological:     General: No focal deficit present.     Mental Status: He is alert and oriented to person, place, and time. Mental status is at baseline.  Psychiatric:        Mood and Affect: Mood normal.        Behavior: Behavior normal.      UC Treatments / Results  Labs (all labs ordered are listed, but only abnormal results are displayed) Labs Reviewed  RESP PANEL BY RT-PCR (FLU A&B, COVID) [id number]    EKG   Radiology No results found.  Procedures Procedures (including critical care time)  Medications Ordered in UC Medications - No data to display  Initial Impression / Assessment and Plan / UC Course  I have reviewed the triage vital signs and the nursing notes.  Pertinent labs & imaging results that were available during my care of the patient were reviewed by me and considered in my medical decision making (see chart for details).     Patient presents with symptoms likely from a viral upper respiratory infection. Differential includes bacterial pneumonia, sinusitis, allergic rhinitis, COVID-19, flu, RSV. Do not suspect underlying cardiopulmonary process. Symptoms seem unlikely related to ACS, CHF or COPD exacerbations, pneumonia, pneumothorax. Patient is nontoxic appearing and not in need of emergent medical intervention.  Patient requesting COVID and flu testing so this is pending.  He is outside of the treatment window for Tamiflu and Paxlovid.  Recommended symptom control with medications and supportive care.  Patient reports symptoms are improving.  Patient sent prescriptions to help alleviate symptoms.  Return if symptoms fail to improve in 1-2 weeks or you develop shortness of breath, chest pain, severe headache. Patient states understanding and is agreeable.  Discharged with PCP followup.  Final  Clinical Impressions(s) / UC Diagnoses   Final diagnoses:  Viral upper respiratory tract infection with cough     Discharge Instructions      You have a viral upper respiratory infection which should run its course and self resolve with symptomatic treatment.  I have prescribed you a few different medications to  alleviate symptoms.  COVID and flu test is pending.  We will call if it is positive.  Please follow-up if symptoms persist or worsen.    ED Prescriptions     Medication Sig Dispense Auth. Provider   fluticasone (FLONASE) 50 MCG/ACT nasal spray Place 1 spray into both nostrils daily. 16 g Dyland Panuco, Rolly Salter E, [place]   benzonatate (TESSALON) 100 MG capsule Take 1 capsule (100 mg total) by mouth every 8 (eight) hours as needed for cough. 21 capsule Nazlini, Acie Fredrickson, [place]      PDMP not reviewed this encounter.   Gustavus Bryant, [place] 11/07/22 [phone]

## 2022-11-08 LAB — RESP PANEL BY RT-PCR (FLU A&B, COVID) ARPGX2
Influenza A by PCR: POSITIVE — AB
Influenza B by PCR: NEGATIVE
SARS Coronavirus 2 by RT PCR: NEGATIVE
